# Patient Record
Sex: Female | Born: 1960 | Race: White | Hispanic: No | Marital: Married | Smoking: Never smoker
Health system: Southern US, Community
[De-identification: ages and names within clinical notes are randomized; demographics above are authoritative.]

## PROBLEM LIST (undated history)

## (undated) DIAGNOSIS — R253 Fasciculation: Secondary | ICD-10-CM

## (undated) DIAGNOSIS — M791 Myalgia, unspecified site: Secondary | ICD-10-CM

## (undated) DIAGNOSIS — K449 Diaphragmatic hernia without obstruction or gangrene: Secondary | ICD-10-CM

## (undated) HISTORY — DX: Diaphragmatic hernia without obstruction or gangrene: K44.9

## (undated) HISTORY — DX: Fasciculation: R25.3

## (undated) HISTORY — DX: Myalgia, unspecified site: M79.10

## (undated) HISTORY — PX: OTHER SURGICAL HISTORY: SHX169

---

## 2000-07-10 ENCOUNTER — Ambulatory Visit (HOSPITAL_COMMUNITY): Admission: RE | Admit: 2000-07-10 | Discharge: 2000-07-10 | Payer: Self-pay | Admitting: Pediatrics

## 2000-07-10 ENCOUNTER — Encounter: Payer: Self-pay | Admitting: Pediatrics

## 2000-07-16 ENCOUNTER — Encounter: Payer: Self-pay | Admitting: Pediatrics

## 2000-07-16 ENCOUNTER — Ambulatory Visit (HOSPITAL_COMMUNITY): Admission: RE | Admit: 2000-07-16 | Discharge: 2000-07-16 | Payer: Self-pay | Admitting: Pediatrics

## 2000-08-26 ENCOUNTER — Other Ambulatory Visit: Admission: RE | Admit: 2000-08-26 | Discharge: 2000-08-26 | Payer: Self-pay | Admitting: Specialist

## 2000-09-01 ENCOUNTER — Ambulatory Visit (HOSPITAL_COMMUNITY): Admission: RE | Admit: 2000-09-01 | Discharge: 2000-09-01 | Payer: Self-pay | Admitting: Internal Medicine

## 2002-07-06 ENCOUNTER — Encounter: Payer: Self-pay | Admitting: Specialist

## 2002-07-06 ENCOUNTER — Ambulatory Visit (HOSPITAL_COMMUNITY): Admission: RE | Admit: 2002-07-06 | Discharge: 2002-07-06 | Payer: Self-pay | Admitting: Specialist

## 2003-04-17 ENCOUNTER — Ambulatory Visit (HOSPITAL_COMMUNITY): Admission: RE | Admit: 2003-04-17 | Discharge: 2003-04-17 | Payer: Self-pay | Admitting: Internal Medicine

## 2004-09-17 ENCOUNTER — Ambulatory Visit (HOSPITAL_COMMUNITY): Admission: RE | Admit: 2004-09-17 | Discharge: 2004-09-17 | Payer: Self-pay | Admitting: Specialist

## 2005-09-18 ENCOUNTER — Ambulatory Visit (HOSPITAL_COMMUNITY): Admission: RE | Admit: 2005-09-18 | Discharge: 2005-09-18 | Payer: Self-pay | Admitting: Specialist

## 2006-09-21 ENCOUNTER — Ambulatory Visit (HOSPITAL_COMMUNITY): Admission: RE | Admit: 2006-09-21 | Discharge: 2006-09-21 | Payer: Self-pay | Admitting: Specialist

## 2006-10-14 ENCOUNTER — Ambulatory Visit (HOSPITAL_COMMUNITY): Admission: RE | Admit: 2006-10-14 | Discharge: 2006-10-14 | Payer: Self-pay | Admitting: Internal Medicine

## 2007-04-28 ENCOUNTER — Encounter: Payer: Self-pay | Admitting: Orthopedic Surgery

## 2007-05-23 ENCOUNTER — Encounter: Payer: Self-pay | Admitting: Orthopedic Surgery

## 2007-11-22 ENCOUNTER — Other Ambulatory Visit: Admission: RE | Admit: 2007-11-22 | Discharge: 2007-11-22 | Payer: Self-pay | Admitting: Obstetrics & Gynecology

## 2008-02-21 ENCOUNTER — Ambulatory Visit (HOSPITAL_COMMUNITY): Admission: RE | Admit: 2008-02-21 | Discharge: 2008-02-21 | Payer: Self-pay | Admitting: Internal Medicine

## 2008-06-02 ENCOUNTER — Ambulatory Visit (HOSPITAL_COMMUNITY): Admission: RE | Admit: 2008-06-02 | Discharge: 2008-06-02 | Payer: Self-pay | Admitting: Internal Medicine

## 2008-08-10 ENCOUNTER — Emergency Department (HOSPITAL_COMMUNITY): Admission: EM | Admit: 2008-08-10 | Discharge: 2008-08-10 | Payer: Self-pay | Admitting: Emergency Medicine

## 2008-09-12 ENCOUNTER — Ambulatory Visit (HOSPITAL_COMMUNITY): Admission: RE | Admit: 2008-09-12 | Discharge: 2008-09-12 | Payer: Self-pay | Admitting: Obstetrics & Gynecology

## 2008-12-11 ENCOUNTER — Other Ambulatory Visit: Admission: RE | Admit: 2008-12-11 | Discharge: 2008-12-11 | Payer: Self-pay | Admitting: Obstetrics & Gynecology

## 2008-12-13 ENCOUNTER — Ambulatory Visit (HOSPITAL_COMMUNITY): Admission: RE | Admit: 2008-12-13 | Discharge: 2008-12-13 | Payer: Self-pay | Admitting: Nurse Practitioner

## 2009-09-17 ENCOUNTER — Ambulatory Visit (HOSPITAL_COMMUNITY): Admission: RE | Admit: 2009-09-17 | Discharge: 2009-09-17 | Payer: Self-pay | Admitting: Internal Medicine

## 2010-04-15 ENCOUNTER — Encounter: Payer: Self-pay | Admitting: Specialist

## 2010-06-19 ENCOUNTER — Other Ambulatory Visit (HOSPITAL_COMMUNITY)
Admission: RE | Admit: 2010-06-19 | Discharge: 2010-06-19 | Disposition: A | Discharge status: Home / Self Care | Payer: BC Managed Care – PPO | Source: Ambulatory Visit | Attending: Obstetrics & Gynecology | Admitting: Obstetrics & Gynecology

## 2010-06-19 ENCOUNTER — Other Ambulatory Visit: Payer: Self-pay | Admitting: Obstetrics & Gynecology

## 2010-06-19 DIAGNOSIS — Z01419 Encounter for gynecological examination (general) (routine) without abnormal findings: Secondary | ICD-10-CM | POA: Insufficient documentation

## 2010-07-02 LAB — CBC
HCT: 38.9 % (ref 36.0–46.0)
WBC: 6.2 10*3/uL (ref 4.0–10.5)

## 2010-07-02 LAB — POCT CARDIAC MARKERS
CKMB, poc: <1 ng/mL — ABNORMAL LOW (ref 1.0–8.0)
Myoglobin, poc: 30.1 ng/mL (ref 12–200)
Troponin i, poc: <0.05 ng/mL (ref 0.00–0.09)

## 2010-07-02 LAB — URINE MICROSCOPIC-ADD ON

## 2010-07-02 LAB — URINE CULTURE
Colony Count: NO GROWTH
Culture: NO GROWTH

## 2010-07-02 LAB — COMPREHENSIVE METABOLIC PANEL WITH GFR
ALT: 13 U/L (ref 0–35)
AST: 15 U/L (ref 0–37)
Albumin: 3.8 g/dL (ref 3.5–5.2)
Alkaline Phosphatase: 49 U/L (ref 39–117)
BUN: 8 mg/dL (ref 6–23)
CO2: 25 meq/L (ref 19–32)
Calcium: 8.5 mg/dL (ref 8.4–10.5)
Chloride: 103 meq/L (ref 96–112)
Creatinine, Ser: 0.63 mg/dL (ref 0.4–1.2)
GFR calc non Af Amer: >60 mL/min
Glucose, Bld: 89 mg/dL (ref 70–99)
Potassium: 3.6 meq/L (ref 3.5–5.1)
Sodium: 135 meq/L (ref 135–145)
Total Bilirubin: 0.7 mg/dL (ref 0.3–1.2)
Total Protein: 7.3 g/dL (ref 6.0–8.3)

## 2010-07-02 LAB — URINALYSIS, ROUTINE W REFLEX MICROSCOPIC
Bilirubin Urine: NEGATIVE
Ketones, ur: NEGATIVE mg/dL
Leukocytes, UA: NEGATIVE
Specific Gravity, Urine: <1.005 — ABNORMAL LOW (ref 1.005–1.030)
Urobilinogen, UA: 0.2 mg/dL (ref 0.0–1.0)
pH: 6 (ref 5.0–8.0)

## 2010-07-02 LAB — DIFFERENTIAL
Basophils Absolute: 0 K/uL (ref 0.0–0.1)
Basophils Relative: 1 % (ref 0–1)
Eosinophils Absolute: 0.1 K/uL (ref 0.0–0.7)
Eosinophils Relative: 1 % (ref 0–5)
Lymphocytes Relative: 25 % (ref 12–46)
Lymphs Abs: 1.6 K/uL (ref 0.7–4.0)
Monocytes Absolute: 0.5 K/uL (ref 0.1–1.0)
Monocytes Relative: 8 % (ref 3–12)
Neutro Abs: 4.1 K/uL (ref 1.7–7.7)
Neutrophils Relative %: 66 % (ref 43–77)

## 2011-06-23 ENCOUNTER — Other Ambulatory Visit (HOSPITAL_COMMUNITY): Payer: Self-pay | Admitting: Internal Medicine

## 2011-06-23 DIAGNOSIS — Z139 Encounter for screening, unspecified: Secondary | ICD-10-CM

## 2011-06-24 ENCOUNTER — Ambulatory Visit (HOSPITAL_COMMUNITY)
Admission: RE | Admit: 2011-06-24 | Discharge: 2011-06-24 | Disposition: A | Discharge status: Home / Self Care | Payer: BC Managed Care – PPO | Source: Ambulatory Visit | Attending: Internal Medicine | Admitting: Internal Medicine

## 2011-06-24 DIAGNOSIS — N6459 Other signs and symptoms in breast: Secondary | ICD-10-CM | POA: Insufficient documentation

## 2011-06-24 DIAGNOSIS — Z139 Encounter for screening, unspecified: Secondary | ICD-10-CM

## 2011-06-24 DIAGNOSIS — Z1231 Encounter for screening mammogram for malignant neoplasm of breast: Secondary | ICD-10-CM | POA: Insufficient documentation

## 2011-06-26 ENCOUNTER — Other Ambulatory Visit: Payer: Self-pay | Admitting: Internal Medicine

## 2011-06-26 DIAGNOSIS — R928 Other abnormal and inconclusive findings on diagnostic imaging of breast: Secondary | ICD-10-CM

## 2011-06-27 ENCOUNTER — Other Ambulatory Visit: Payer: Self-pay | Admitting: Internal Medicine

## 2011-06-27 DIAGNOSIS — R928 Other abnormal and inconclusive findings on diagnostic imaging of breast: Secondary | ICD-10-CM

## 2011-07-02 ENCOUNTER — Ambulatory Visit
Admission: RE | Admit: 2011-07-02 | Discharge: 2011-07-02 | Disposition: A | Discharge status: Home / Self Care | Payer: BC Managed Care – PPO | Source: Ambulatory Visit | Attending: Internal Medicine | Admitting: Internal Medicine

## 2011-07-02 DIAGNOSIS — R928 Other abnormal and inconclusive findings on diagnostic imaging of breast: Secondary | ICD-10-CM

## 2011-07-07 ENCOUNTER — Other Ambulatory Visit (HOSPITAL_COMMUNITY)
Admission: RE | Admit: 2011-07-07 | Discharge: 2011-07-07 | Disposition: A | Discharge status: Home / Self Care | Payer: BC Managed Care – PPO | Source: Ambulatory Visit | Attending: Obstetrics & Gynecology | Admitting: Obstetrics & Gynecology

## 2011-07-07 ENCOUNTER — Other Ambulatory Visit: Payer: Self-pay | Admitting: Obstetrics & Gynecology

## 2011-07-07 DIAGNOSIS — Z01419 Encounter for gynecological examination (general) (routine) without abnormal findings: Secondary | ICD-10-CM | POA: Insufficient documentation

## 2011-12-08 ENCOUNTER — Other Ambulatory Visit (HOSPITAL_COMMUNITY): Payer: Self-pay | Admitting: Family Medicine

## 2011-12-08 DIAGNOSIS — R109 Unspecified abdominal pain: Secondary | ICD-10-CM

## 2011-12-11 ENCOUNTER — Other Ambulatory Visit (HOSPITAL_COMMUNITY): Payer: BC Managed Care – PPO

## 2011-12-15 ENCOUNTER — Ambulatory Visit (HOSPITAL_COMMUNITY)
Admission: RE | Admit: 2011-12-15 | Discharge: 2011-12-15 | Disposition: A | Discharge status: Home / Self Care | Payer: BC Managed Care – PPO | Source: Ambulatory Visit | Attending: Family Medicine | Admitting: Family Medicine

## 2011-12-15 DIAGNOSIS — R109 Unspecified abdominal pain: Secondary | ICD-10-CM

## 2012-06-30 ENCOUNTER — Other Ambulatory Visit: Payer: Self-pay

## 2012-06-30 DIAGNOSIS — Z1231 Encounter for screening mammogram for malignant neoplasm of breast: Secondary | ICD-10-CM

## 2012-07-13 ENCOUNTER — Ambulatory Visit
Admission: RE | Admit: 2012-07-13 | Discharge: 2012-07-13 | Disposition: A | Discharge status: Home / Self Care | Payer: BC Managed Care – PPO | Source: Ambulatory Visit

## 2012-07-13 DIAGNOSIS — Z1231 Encounter for screening mammogram for malignant neoplasm of breast: Secondary | ICD-10-CM

## 2013-06-10 ENCOUNTER — Other Ambulatory Visit: Payer: Self-pay

## 2013-06-10 DIAGNOSIS — Z1231 Encounter for screening mammogram for malignant neoplasm of breast: Secondary | ICD-10-CM

## 2013-09-07 ENCOUNTER — Ambulatory Visit
Admission: RE | Admit: 2013-09-07 | Discharge: 2013-09-07 | Disposition: A | Discharge status: Home / Self Care | Payer: BC Managed Care – PPO | Source: Ambulatory Visit

## 2013-09-07 DIAGNOSIS — Z1231 Encounter for screening mammogram for malignant neoplasm of breast: Secondary | ICD-10-CM

## 2013-11-10 ENCOUNTER — Other Ambulatory Visit (HOSPITAL_COMMUNITY): Payer: Self-pay | Admitting: Internal Medicine

## 2013-11-10 DIAGNOSIS — Z139 Encounter for screening, unspecified: Secondary | ICD-10-CM

## 2013-11-10 DIAGNOSIS — Z13820 Encounter for screening for osteoporosis: Secondary | ICD-10-CM

## 2013-11-16 ENCOUNTER — Ambulatory Visit (HOSPITAL_COMMUNITY)
Admission: RE | Admit: 2013-11-16 | Discharge: 2013-11-16 | Disposition: A | Discharge status: Home / Self Care | Payer: BC Managed Care – PPO | Source: Ambulatory Visit | Attending: Internal Medicine | Admitting: Internal Medicine

## 2013-11-16 DIAGNOSIS — Z13820 Encounter for screening for osteoporosis: Secondary | ICD-10-CM | POA: Diagnosis not present

## 2014-03-31 ENCOUNTER — Other Ambulatory Visit (HOSPITAL_COMMUNITY): Payer: Self-pay | Admitting: Internal Medicine

## 2014-03-31 ENCOUNTER — Ambulatory Visit (HOSPITAL_COMMUNITY)
Admission: RE | Admit: 2014-03-31 | Discharge: 2014-03-31 | Disposition: A | Discharge status: Home / Self Care | Payer: BC Managed Care – PPO | Source: Ambulatory Visit | Attending: Internal Medicine | Admitting: Internal Medicine

## 2014-03-31 DIAGNOSIS — M25551 Pain in right hip: Secondary | ICD-10-CM

## 2014-03-31 DIAGNOSIS — R609 Edema, unspecified: Secondary | ICD-10-CM

## 2014-06-01 ENCOUNTER — Ambulatory Visit (HOSPITAL_COMMUNITY)
Admission: RE | Admit: 2014-06-01 | Discharge: 2014-06-01 | Disposition: A | Discharge status: Home / Self Care | Payer: BC Managed Care – PPO | Source: Ambulatory Visit | Attending: Internal Medicine | Admitting: Internal Medicine

## 2014-06-01 ENCOUNTER — Other Ambulatory Visit (HOSPITAL_COMMUNITY): Payer: Self-pay | Admitting: Internal Medicine

## 2014-06-01 DIAGNOSIS — M545 Low back pain: Secondary | ICD-10-CM

## 2014-09-05 ENCOUNTER — Ambulatory Visit (HOSPITAL_COMMUNITY)
Admission: RE | Admit: 2014-09-05 | Discharge: 2014-09-05 | Disposition: A | Discharge status: Home / Self Care | Payer: BC Managed Care – PPO | Source: Ambulatory Visit | Attending: Internal Medicine | Admitting: Internal Medicine

## 2014-09-05 ENCOUNTER — Other Ambulatory Visit (HOSPITAL_COMMUNITY): Payer: Self-pay | Admitting: Internal Medicine

## 2014-09-05 DIAGNOSIS — M542 Cervicalgia: Secondary | ICD-10-CM | POA: Diagnosis not present

## 2014-10-03 ENCOUNTER — Ambulatory Visit (INDEPENDENT_AMBULATORY_CARE_PROVIDER_SITE_OTHER): Payer: BC Managed Care – PPO | Admitting: Orthopedic Surgery

## 2014-10-03 VITALS — BP 120/74 | Ht 64.0 in | Wt 160.0 lb

## 2014-10-03 DIAGNOSIS — M542 Cervicalgia: Secondary | ICD-10-CM

## 2014-10-03 NOTE — Progress Notes (Signed)
Patient ID: Cassandra JourneySuzanne A Taylor, female   DOB: 04/14/1960, 54 y.o.   MRN: [id number] New   Chief Complaint  Patient presents with  . Back Pain    constant discomfort in c spine and T spine, radiates to right shoulder, refer [facility] hall     Cassandra Taylor is a 54 y.o. female.   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 Her neck pain is at the central portion of the upper thoracic and lower cervical spine and is associated with stiffness and occasional radiating pain up into the upper chest area.   It was recommended that she have physical therapy but she wanted an orthopedic opinion.  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 Her thoracic spine shows a subtle curvature of the thoracolumbar spine which is convex right no change from previous x-rays. Review of Systems See hpi   Social History History  Substance Use Topics  . Smoking status: Not on file  . Smokeless tobacco: Not on file  . Alcohol Use: Not on file      No current outpatient prescriptions on file.   No current facility-administered medications for this visit.       Physical Exam Blood pressure 120/74, height 5\' 4"  (1.626 m), weight 160 lb (72.576 kg). Physical Exam The patient is well developed well nourished and well groomed. Orientation to person place and time is normal   Mood is pleasant. Ambulatory status  Normal ambulatory status  We did a thorough neurologic exam and we didn't find any abnormalities. She has some mild discomfort when she rotated her neck back and forth and up and down and side to side but otherwise that was normal and there was no evidence of radicular symptoms with that maneuver. She has some tenderness at the C6-7 disc space. Her upper extremities showed no abnormalities for range of motion no instabilities no sensory changes no  vascular changes no reflex deficits good pulses and there is no lymphadenopath  Data Reviewed See hpi  Assessment Encounter Diagnosis  Name Primary?  . Cervicalgia Yes    Plan  physical therapy  800 mg ibuprofen

## 2014-10-03 NOTE — Patient Instructions (Signed)
Call [facility] in [place] to schedule therapy

## 2014-10-17 ENCOUNTER — Other Ambulatory Visit: Payer: Self-pay

## 2014-10-17 DIAGNOSIS — Z1231 Encounter for screening mammogram for malignant neoplasm of breast: Secondary | ICD-10-CM

## 2014-10-18 ENCOUNTER — Ambulatory Visit
Admission: RE | Admit: 2014-10-18 | Discharge: 2014-10-18 | Disposition: A | Discharge status: Home / Self Care | Payer: BC Managed Care – PPO | Source: Ambulatory Visit

## 2014-10-18 DIAGNOSIS — Z1231 Encounter for screening mammogram for malignant neoplasm of breast: Secondary | ICD-10-CM

## 2016-03-12 ENCOUNTER — Other Ambulatory Visit: Payer: Self-pay | Admitting: Internal Medicine

## 2016-03-12 DIAGNOSIS — Z1231 Encounter for screening mammogram for malignant neoplasm of breast: Secondary | ICD-10-CM

## 2016-03-13 ENCOUNTER — Ambulatory Visit
Admission: RE | Admit: 2016-03-13 | Discharge: 2016-03-13 | Disposition: A | Discharge status: Home / Self Care | Payer: BC Managed Care – PPO | Source: Ambulatory Visit | Attending: Internal Medicine | Admitting: Internal Medicine

## 2016-03-13 DIAGNOSIS — Z1231 Encounter for screening mammogram for malignant neoplasm of breast: Secondary | ICD-10-CM

## 2017-04-08 ENCOUNTER — Other Ambulatory Visit: Payer: Self-pay | Admitting: Internal Medicine

## 2017-04-08 DIAGNOSIS — Z139 Encounter for screening, unspecified: Secondary | ICD-10-CM

## 2017-04-29 ENCOUNTER — Ambulatory Visit
Admission: RE | Admit: 2017-04-29 | Discharge: 2017-04-29 | Disposition: A | Discharge status: Home / Self Care | Payer: BC Managed Care – PPO | Source: Ambulatory Visit | Attending: Internal Medicine | Admitting: Internal Medicine

## 2017-04-29 DIAGNOSIS — Z139 Encounter for screening, unspecified: Secondary | ICD-10-CM

## 2018-06-04 ENCOUNTER — Other Ambulatory Visit: Payer: Self-pay | Admitting: Internal Medicine

## 2018-06-04 DIAGNOSIS — Z1231 Encounter for screening mammogram for malignant neoplasm of breast: Secondary | ICD-10-CM

## 2018-07-05 ENCOUNTER — Ambulatory Visit: Payer: BC Managed Care – PPO

## 2018-08-17 ENCOUNTER — Ambulatory Visit: Payer: BC Managed Care – PPO

## 2018-08-23 ENCOUNTER — Ambulatory Visit: Payer: BC Managed Care – PPO

## 2018-09-17 ENCOUNTER — Ambulatory Visit: Payer: BC Managed Care – PPO

## 2018-10-26 ENCOUNTER — Other Ambulatory Visit: Payer: Self-pay

## 2018-10-26 ENCOUNTER — Ambulatory Visit
Admission: RE | Admit: 2018-10-26 | Discharge: 2018-10-26 | Disposition: A | Discharge status: Home / Self Care | Payer: BC Managed Care – PPO | Source: Ambulatory Visit | Attending: Internal Medicine | Admitting: Internal Medicine

## 2018-10-26 DIAGNOSIS — Z1231 Encounter for screening mammogram for malignant neoplasm of breast: Secondary | ICD-10-CM

## 2018-10-28 ENCOUNTER — Other Ambulatory Visit: Payer: Self-pay | Admitting: Internal Medicine

## 2018-10-28 DIAGNOSIS — R928 Other abnormal and inconclusive findings on diagnostic imaging of breast: Secondary | ICD-10-CM

## 2018-11-01 ENCOUNTER — Ambulatory Visit
Admission: RE | Admit: 2018-11-01 | Discharge: 2018-11-01 | Disposition: A | Discharge status: Home / Self Care | Payer: BC Managed Care – PPO | Source: Ambulatory Visit | Attending: Internal Medicine | Admitting: Internal Medicine

## 2018-11-01 ENCOUNTER — Other Ambulatory Visit: Payer: Self-pay

## 2018-11-01 DIAGNOSIS — R928 Other abnormal and inconclusive findings on diagnostic imaging of breast: Secondary | ICD-10-CM

## 2018-11-02 ENCOUNTER — Other Ambulatory Visit: Payer: Self-pay | Admitting: Internal Medicine

## 2018-11-02 ENCOUNTER — Other Ambulatory Visit: Payer: Self-pay | Admitting: Dermatology

## 2019-09-30 ENCOUNTER — Other Ambulatory Visit: Payer: Self-pay | Admitting: Internal Medicine

## 2019-09-30 DIAGNOSIS — Z1231 Encounter for screening mammogram for malignant neoplasm of breast: Secondary | ICD-10-CM

## 2019-10-27 ENCOUNTER — Ambulatory Visit: Payer: BC Managed Care – PPO

## 2019-11-03 ENCOUNTER — Ambulatory Visit
Admission: RE | Admit: 2019-11-03 | Discharge: 2019-11-03 | Disposition: A | Discharge status: Home / Self Care | Payer: BC Managed Care – PPO | Source: Ambulatory Visit | Attending: Internal Medicine | Admitting: Internal Medicine

## 2019-11-03 ENCOUNTER — Other Ambulatory Visit: Payer: Self-pay

## 2019-11-03 DIAGNOSIS — Z1231 Encounter for screening mammogram for malignant neoplasm of breast: Secondary | ICD-10-CM

## 2019-11-04 ENCOUNTER — Other Ambulatory Visit: Payer: Self-pay | Admitting: Internal Medicine

## 2019-11-04 DIAGNOSIS — R928 Other abnormal and inconclusive findings on diagnostic imaging of breast: Secondary | ICD-10-CM

## 2019-11-18 ENCOUNTER — Other Ambulatory Visit: Payer: Self-pay | Admitting: Internal Medicine

## 2019-11-18 ENCOUNTER — Ambulatory Visit
Admission: RE | Admit: 2019-11-18 | Discharge: 2019-11-18 | Disposition: A | Discharge status: Home / Self Care | Payer: BC Managed Care – PPO | Source: Ambulatory Visit | Attending: Internal Medicine | Admitting: Internal Medicine

## 2019-11-18 ENCOUNTER — Other Ambulatory Visit: Payer: Self-pay

## 2019-11-18 DIAGNOSIS — R928 Other abnormal and inconclusive findings on diagnostic imaging of breast: Secondary | ICD-10-CM

## 2019-11-21 ENCOUNTER — Other Ambulatory Visit: Payer: Self-pay | Admitting: Internal Medicine

## 2019-11-21 DIAGNOSIS — R928 Other abnormal and inconclusive findings on diagnostic imaging of breast: Secondary | ICD-10-CM

## 2019-11-30 ENCOUNTER — Ambulatory Visit
Admission: RE | Admit: 2019-11-30 | Discharge: 2019-11-30 | Disposition: A | Discharge status: Home / Self Care | Payer: BC Managed Care – PPO | Source: Ambulatory Visit | Attending: Internal Medicine | Admitting: Internal Medicine

## 2019-11-30 ENCOUNTER — Other Ambulatory Visit (HOSPITAL_COMMUNITY)
Admission: RE | Admit: 2019-11-30 | Discharge: 2019-11-30 | Disposition: A | Discharge status: Home / Self Care | Payer: BC Managed Care – PPO | Source: Ambulatory Visit | Attending: Diagnostic Radiology | Admitting: Diagnostic Radiology

## 2019-11-30 ENCOUNTER — Other Ambulatory Visit: Payer: Self-pay

## 2019-11-30 DIAGNOSIS — R928 Other abnormal and inconclusive findings on diagnostic imaging of breast: Secondary | ICD-10-CM

## 2019-11-30 HISTORY — PX: BREAST BIOPSY: SHX20

## 2019-12-01 LAB — SURGICAL PATHOLOGY

## 2020-01-26 ENCOUNTER — Encounter: Payer: Self-pay | Admitting: Neurology

## 2020-01-26 ENCOUNTER — Ambulatory Visit: Payer: BC Managed Care – PPO | Admitting: Neurology

## 2020-01-26 VITALS — BP 148/90 | HR 84 | Ht 64.0 in | Wt 177.0 lb

## 2020-01-26 DIAGNOSIS — R253 Fasciculation: Secondary | ICD-10-CM | POA: Diagnosis not present

## 2020-01-26 MED ORDER — DULOXETINE HCL 30 MG PO CPEP: 30.0000 mg | ORAL_CAPSULE | Freq: Every day | ORAL | 3 refills | Status: DC

## 2020-01-26 NOTE — Progress Notes (Signed)
Chief Complaint  Patient presents with  . New Patient (Initial Visit)    Referred for fasciculations in variouls muscles. She seems to notice it more in her right arm. Previously seen by [facility].   Marland Kitchen PCP    Benita Stabile, MD    HISTORICAL  Cassandra Taylor is a 59 year old female, seen in request by her primary care doctor Benita Stabile, for evaluation of muscle fasciculations, initial evaluation was on January 26, 2020.  I reviewed and summarized the referring note,  She began to experience muscle fasciculation in 2019, initially triggered by walk on treadmill for 1 mile, and there is mild weight lifting, the next day following her exercise, she felt her left hand muscle twitching, as if the cell phone was Vaseline in her hand, since then, she began to experience intermittent muscle fasciculations, she d describes buzzing, flutter sensation at the bottom of her feet, sometimes intermittent muscle twitching involving other part of her body, such as left arm, right arm, buttock region,  Muscle fasciculation usually triggered by heavy lifting, muscle exertion, she denies muscle weakness, denies sensory changes, there was no limitation in her daily activity,  She did have diagnosis of myofascial pain, was seen by rheumatologist, no clear etiology found, was given prescription of Flexeril, which has helped for  She works as a Runner, broadcasting/film/video, 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  REVIEW OF SYSTEMS: Full 14 system review of systems performed and notable only for as above all other review of systems were negative.  ALLERGIES: No Known Allergies  HOME MEDICATIONS: Current Outpatient Medications  Medication Sig Dispense Refill  .  cyclobenzaprine (FLEXERIL) 5 MG tablet Take 5 mg by mouth at bedtime.    Marland Kitchen ibuprofen (ADVIL) 200 MG tablet Take 400 mg by mouth at bedtime.      No current facility-administered medications for this visit.    PAST MEDICAL HISTORY: Past Medical History:  Diagnosis Date  . Fasciculation   . Muscle pain     PAST SURGICAL HISTORY: Past Surgical History:  Procedure Laterality Date  . No past surgery      FAMILY HISTORY: Family History  Problem Relation Age of Onset  . Alzheimer's disease Mother   . COPD Father     SOCIAL HISTORY: Social History   Socioeconomic History  . Marital status: Married    Spouse name: Not on file  . Number of children: 2  . Years of education: college  . Highest education level: Not on file  Occupational History  . Occupation: Runner, broadcasting/film/video  Tobacco Use  . Smoking status: Never Smoker  . Smokeless tobacco: Never Used  Substance and Sexual Activity  . Alcohol use: Yes    Comment: rarely  . Drug use: Never  . Sexual activity: Not on file  Other Topics Concern  . Not on file  Social History Narrative   Lives at home with husband.   One soda per day.   Right-handed.   Social Determinants of Health   Financial Resource Strain:   . Difficulty of Paying Living Expenses: Not on file  Food Insecurity:   . Worried About Programme researcher, broadcasting/film/video in the Last Year: Not on file  . Ran Out of  Food in the Last Year: Not on file  Transportation Needs:   . Lack of Transportation (Medical): Not on file  . Lack of Transportation (Non-Medical): Not on file  Physical Activity:   . Days of Exercise per Week: Not on file  . Minutes of Exercise per Session: Not on file  Stress:   . Feeling of Stress : Not on file  Social Connections:   . Frequency of Communication with Friends and Family: Not on file  . Frequency of Social Gatherings with Friends and Family: Not on file  . Attends Religious Services: Not on file  . Active Member of Clubs or Organizations: Not  on file  . Attends Banker Meetings: Not on file  . Marital Status: Not on file  Intimate Partner Violence:   . Fear of Current or Ex-Partner: Not on file  . Emotionally Abused: Not on file  . Physically Abused: Not on file  . Sexually Abused: Not on file     PHYSICAL EXAM   Vitals:   01/26/20 0828  BP: (!) 148/90  Pulse: 84  Weight: 177 lb (80.3 kg)  Height: 5\' 4"  (1.626 m)   Not recorded     Body mass index is 30.38 kg/m.  PHYSICAL EXAMNIATION:  Gen: NAD, conversant, well nourised, well groomed                     Cardiovascular: Regular rate rhythm, no peripheral edema, warm, nontender. Eyes: Conjunctivae clear without exudates or hemorrhage Neck: Supple, no carotid bruits. Pulmonary: Clear to auscultation bilaterally   NEUROLOGICAL EXAM:  MENTAL STATUS: Speech:    Speech is normal; fluent and spontaneous with normal comprehension.  Cognition:     Orientation to time, place and person     Normal recent and remote memory     Normal Attention span and concentration     Normal Language, naming, repeating,spontaneous speech     Fund of knowledge   CRANIAL NERVES: CN II: Visual fields are full to confrontation. Pupils are round equal and briskly reactive to light. CN III, IV, VI: extraocular movement are normal. No ptosis. CN V: Facial sensation is intact to light touch CN VII: Face is symmetric with normal eye closure  CN VIII: Hearing is normal to causal conversation. CN IX, X: Phonation is normal. CN XI: Head turning and shoulder shrug are intact  MOTOR: There is no pronator drift of out-stretched arms. Muscle bulk and tone are normal. Muscle strength is normal.  REFLEXES: Reflexes are 2+ and symmetric at the biceps, triceps, knees, and ankles. Plantar responses are flexor.  SENSORY: Intact to light touch, pinprick and vibratory sensation are intact in fingers and toes.  COORDINATION: There is no trunk or limb dysmetria  noted.  GAIT/STANCE: Posture is normal. Gait is steady with normal steps, base, arm swing, and turning. Heel and toe walking are normal. Tandem gait is normal.  Romberg is absent.   DIAGNOSTIC DATA (LABS, IMAGING, TESTING) - I reviewed patient records, labs, notes, testing and imaging myself where available.   ASSESSMENT AND PLAN  LATOIA EYSTER is a 59 y.o. female   Muscle fasciculations  From history history, it has been ongoing for 2 years, no muscle weakness, no evidence of muscle atrophy, slight brisk reflex, no evidence of pathologic hyperreflexia  Most likely benign muscle fasciculations  Repeat EMG nerve conduction study  Laboratory evaluation to rule out treatable etiology   60, M.D. Ph.D.  [facility]  [address] [place], [place] [address] Ph: [phone] Fax: [phone]  CC:  Benita Stabile, MD [address] [place],  [place] [address]

## 2020-01-28 LAB — TSH: TSH: 2.2 u[IU]/mL (ref 0.450–4.500)

## 2020-01-28 LAB — CK: Total CK: 81 U/L (ref 32–182)

## 2020-01-28 LAB — FERRITIN: Ferritin: 113 ng/mL (ref 15–150)

## 2020-01-28 LAB — SEDIMENTATION RATE: Sed Rate: 11 mm/hr (ref 0–40)

## 2020-01-28 LAB — VITAMIN B12: Vitamin B-12: 381 pg/mL (ref 232–1245)

## 2020-01-28 LAB — FOLATE: Folate: 7.8 ng/mL (ref >3.0)

## 2020-01-28 LAB — COPPER, SERUM: Copper: 142 ug/dL (ref 80–158)

## 2020-01-28 LAB — ANA W/REFLEX IF POSITIVE: Anti Nuclear Antibody (ANA): NEGATIVE

## 2020-01-28 LAB — C-REACTIVE PROTEIN: CRP: 8 mg/L (ref 0–10)

## 2020-01-28 LAB — VITAMIN D 25 HYDROXY (VIT D DEFICIENCY, FRACTURES): Vit D, 25-Hydroxy: 20 ng/mL — ABNORMAL LOW (ref 30.0–100.0)

## 2020-02-15 ENCOUNTER — Ambulatory Visit (INDEPENDENT_AMBULATORY_CARE_PROVIDER_SITE_OTHER): Payer: BC Managed Care – PPO | Admitting: Neurology

## 2020-02-15 ENCOUNTER — Encounter: Payer: BC Managed Care – PPO | Admitting: Neurology

## 2020-02-15 DIAGNOSIS — R253 Fasciculation: Secondary | ICD-10-CM

## 2020-02-15 DIAGNOSIS — Z0289 Encounter for other administrative examinations: Secondary | ICD-10-CM

## 2020-02-15 NOTE — Procedures (Signed)
Full Name: Cassandra Taylor Gender: Female MRN #: [id number] Date of Birth: 09-Apr-1960    Visit Date: 02/15/2020 08:47 Age: 59 Years Examining Physician: Levert Feinstein, MD  Referring Physician: Levert Feinstein, MD Height: 5 feet 4 inch Patient History: 59 year old female presented with few years history of intermittent muscle fasciculation, there was no evidence of muscle atrophy or weakness  Summary of the tests:  Nerve conduction study: Right sural, superficial peroneal, median, ulnar sensory responses were normal  Right peroneal to EDB, tibial, median and ulnar motor responses were normal  Electromyography: Selected needle examination of right lower extremity, upper extremity, right lumbosacral paraspinal and cervical paraspinal muscles were normal.    Conclusion: This is a normal study.  There was no electrodiagnostic evidence of right lumbosacral radiculopathy or right cervical radiculopathy.    ------------------------------- Levert Feinstein, M.D. PhD  [facility] [address] [place], [place] [address] Tel: [phone] Fax: [phone]  Verbal informed consent was obtained from the patient, patient was informed of potential risk of procedure, including bruising, bleeding, hematoma formation, infection, muscle weakness, muscle pain, numbness, among others.         MNC    Nerve / Sites Muscle Latency Ref. Amplitude Ref. Rel Amp Segments Distance Velocity Ref. Area    ms ms mV mV %  cm m/s m/s mVms  R Median - APB     Wrist APB 3.5 ?4.4 7.3 ?4.0 100 Wrist - APB 7   37.2     Upper arm APB 7.1  7.4  101 Upper arm - Wrist 20 56 ?49 36.0  R Ulnar - ADM     Wrist ADM 2.7 ?3.3 13.2 ?6.0 100 Wrist - ADM 7   35.7     B.Elbow ADM 5.7  12.9  97.7 B.Elbow - Wrist 19 64 ?49 35.8     A.Elbow ADM 7.3  12.4  96.5 A.Elbow - B.Elbow 10 62 ?49 35.1         A.Elbow - Wrist      R Peroneal - EDB     Ankle EDB 4.2 ?6.5 6.7 ?2.0 100 Ankle - EDB 9   27.6     Fib head EDB  9.3  5.9  87.7 Fib head - Ankle 27 53 ?44 28.5     Pop fossa EDB 11.4  6.0  102 Pop fossa - Fib head 10 49 ?44 26.4         Pop fossa - Ankle      R Tibial - AH     Ankle AH 4.7 ?5.8 10.5 ?4.0 100 Ankle - AH 9   29.6     Pop fossa AH 13.1  9.2  87.4 Pop fossa - Ankle 37 44 ?41 24.3             SNC    Nerve / Sites Rec. Site Peak Lat Ref.  Amp Ref. Segments Distance Peak Diff Ref.    ms ms V V  cm ms ms  R Sural - Ankle (Calf)     Calf Ankle 2.9 ?4.4 23 ?6 Calf - Ankle 14    R Superficial peroneal - Ankle     Lat leg Ankle 3.4 ?4.4 11 ?6 Lat leg - Ankle 14    R Median, Ulnar - Transcarpal comparison     Median Palm Wrist 2.1 ?2.2 122 ?35 Median Palm - Wrist 8       Ulnar Palm Wrist 2.1 ?2.2 28 ?12  Ulnar Palm - Wrist 8          Median Palm - Ulnar Palm  0.0 ?0.4  R Median - Orthodromic (Dig II, Mid palm)     Dig II Wrist 3.0 ?3.4 23 ?10 Dig II - Wrist 13    R Ulnar - Orthodromic, (Dig V, Mid palm)     Dig V Wrist 2.6 ?3.1 12 ?5 Dig V - Wrist 35                 F  Wave    Nerve F Lat Ref.   ms ms  R Tibial - AH 48.2 ?56.0  R Ulnar - ADM 25.4 ?32.0         EMG Summary Table    Spontaneous MUAP Recruitment  Muscle IA Fib PSW Fasc Other Amp Dur. Poly Pattern  R. Tibialis anterior Normal None None None _______ Normal Normal Normal Normal  R. Tibialis posterior Normal None None None _______ Normal Normal Normal Normal  R. Peroneus longus Normal None None None _______ Normal Normal Normal Normal  R. Gastrocnemius (Medial head) Normal None None None _______ Normal Normal Normal Normal  R. Vastus lateralis Normal None None None _______ Normal Normal Normal Normal  R. Lumbar paraspinals (low) Normal None None None _______ Normal Normal Normal Normal  R. Lumbar paraspinals (mid) Normal None None None _______ Normal Normal Normal Normal  R. First dorsal interosseous Normal None None None _______ Normal Normal Normal Normal  R. Pronator teres Normal None None None _______ Normal Normal  Normal Normal  R. Biceps brachii Normal None None None _______ Normal Normal Normal Normal  R. Deltoid Normal None None None _______ Normal Normal Normal Normal  R. Cervical paraspinals Normal None None None _______ Normal Normal Normal Normal

## 2020-06-06 ENCOUNTER — Other Ambulatory Visit: Payer: Self-pay | Admitting: Internal Medicine

## 2020-06-06 DIAGNOSIS — R928 Other abnormal and inconclusive findings on diagnostic imaging of breast: Secondary | ICD-10-CM

## 2020-07-09 ENCOUNTER — Other Ambulatory Visit: Payer: Self-pay

## 2020-07-09 ENCOUNTER — Ambulatory Visit
Admission: RE | Admit: 2020-07-09 | Discharge: 2020-07-09 | Disposition: A | Discharge status: Home / Self Care | Payer: BC Managed Care – PPO | Source: Ambulatory Visit | Attending: Internal Medicine | Admitting: Internal Medicine

## 2020-07-09 ENCOUNTER — Other Ambulatory Visit: Payer: BC Managed Care – PPO

## 2020-07-09 DIAGNOSIS — R928 Other abnormal and inconclusive findings on diagnostic imaging of breast: Secondary | ICD-10-CM

## 2020-10-08 ENCOUNTER — Other Ambulatory Visit: Payer: Self-pay | Admitting: Internal Medicine

## 2020-10-08 DIAGNOSIS — Z1231 Encounter for screening mammogram for malignant neoplasm of breast: Secondary | ICD-10-CM

## 2020-11-22 ENCOUNTER — Other Ambulatory Visit: Payer: Self-pay

## 2020-11-22 ENCOUNTER — Ambulatory Visit
Admission: RE | Admit: 2020-11-22 | Discharge: 2020-11-22 | Disposition: A | Discharge status: Home / Self Care | Payer: Self-pay | Source: Ambulatory Visit | Attending: Internal Medicine | Admitting: Internal Medicine

## 2020-11-22 DIAGNOSIS — Z1231 Encounter for screening mammogram for malignant neoplasm of breast: Secondary | ICD-10-CM

## 2020-11-29 ENCOUNTER — Other Ambulatory Visit: Payer: Self-pay | Admitting: Internal Medicine

## 2020-11-29 DIAGNOSIS — R928 Other abnormal and inconclusive findings on diagnostic imaging of breast: Secondary | ICD-10-CM

## 2020-12-20 ENCOUNTER — Other Ambulatory Visit: Payer: Self-pay

## 2021-09-09 IMAGING — MG MM DIGITAL SCREENING BILAT W/ TOMO AND CAD
8 series · 8 of 24 positions shown · non-contrast
Comparison: Previous exams.

CLINICAL DATA: Screening. History of right axillary lymph node
biopsy November 2019 with pathology revealing reactive lymph node
hyperplasia with flow cytometry attempted but failed due to
insufficient number of cells.

EXAM:
DIGITAL SCREENING BILATERAL MAMMOGRAM WITH TOMOSYNTHESIS AND CAD
TECHNIQUE: Bilateral screening digital craniocaudal and mediolateral oblique
mammograms were obtained. Bilateral screening digital breast
tomosynthesis was performed. The images were evaluated with
computer-aided detection.

[L MLO synth-2D]
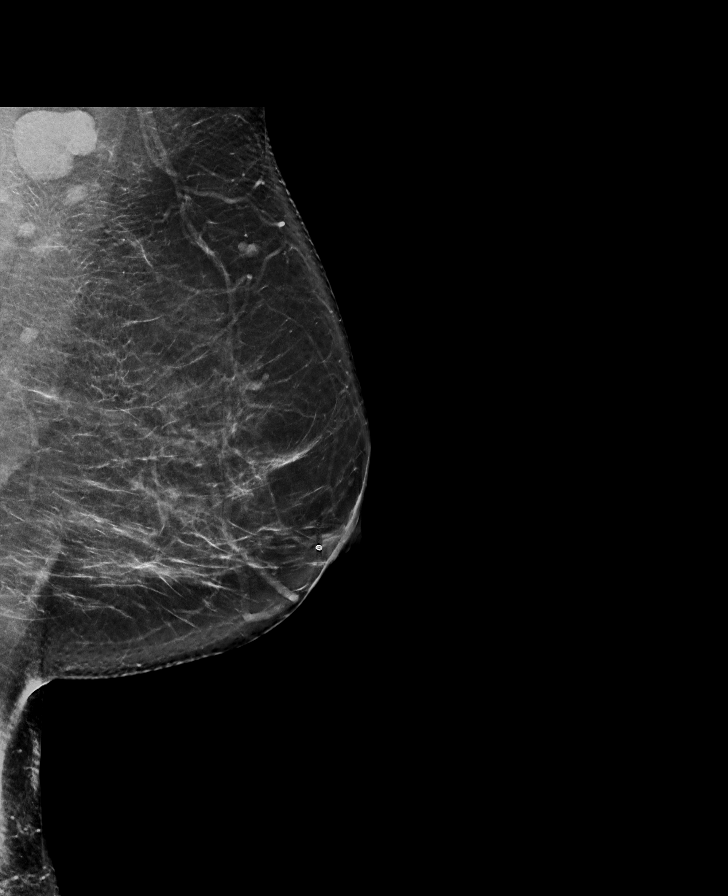

[R CC synth-2D]
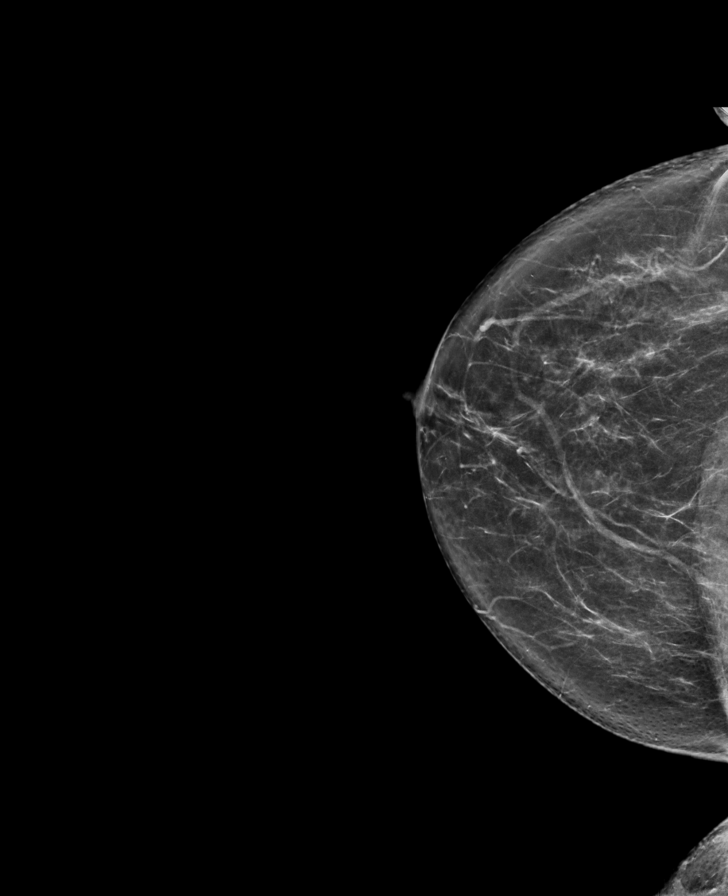

[R MLO synth-2D]
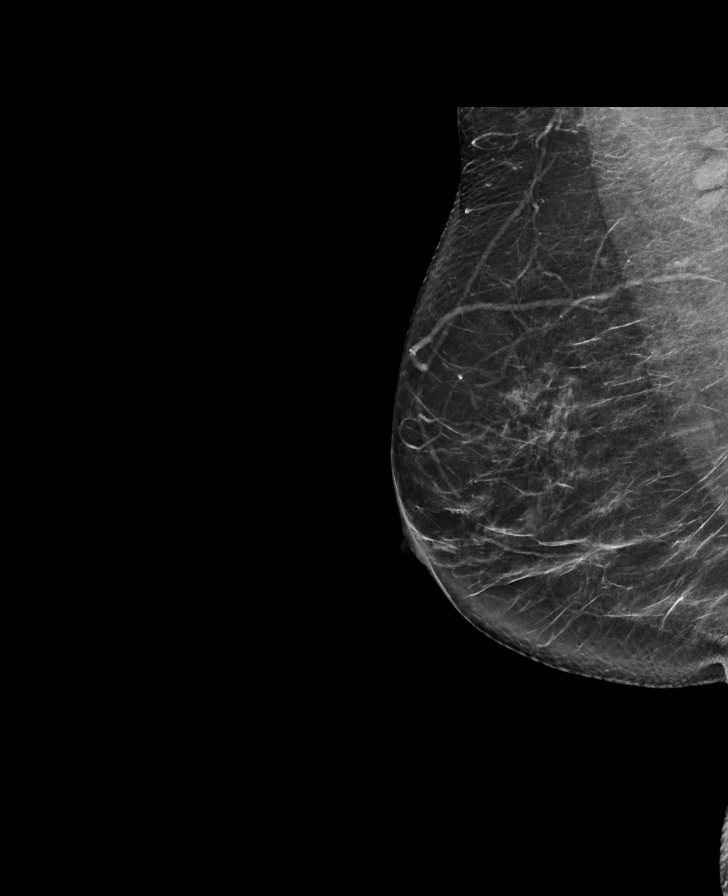

[L CC synth-2D]
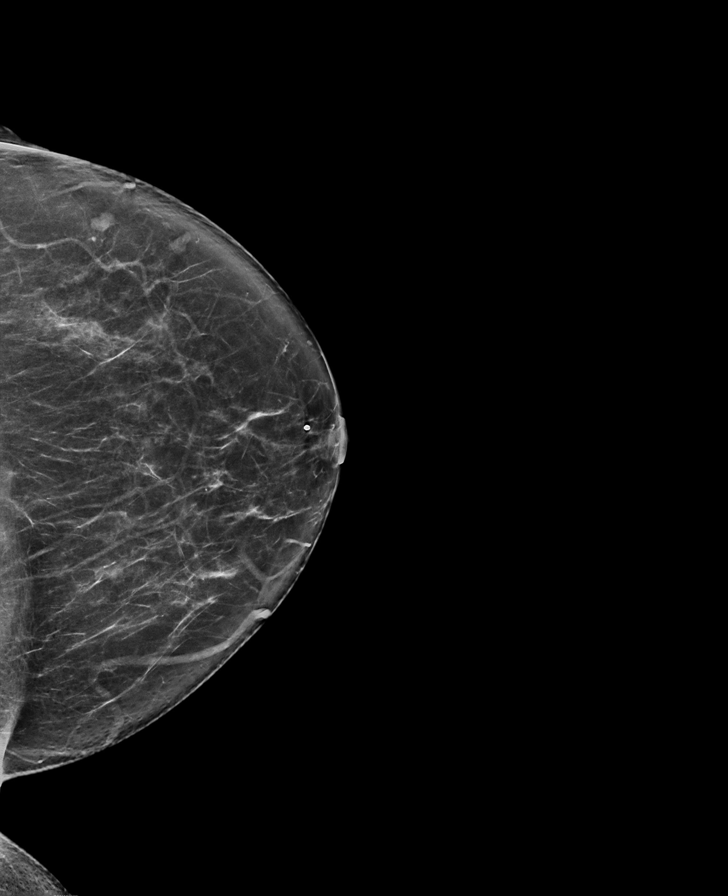

[R CC tomo · tomo slice 37/73.0]
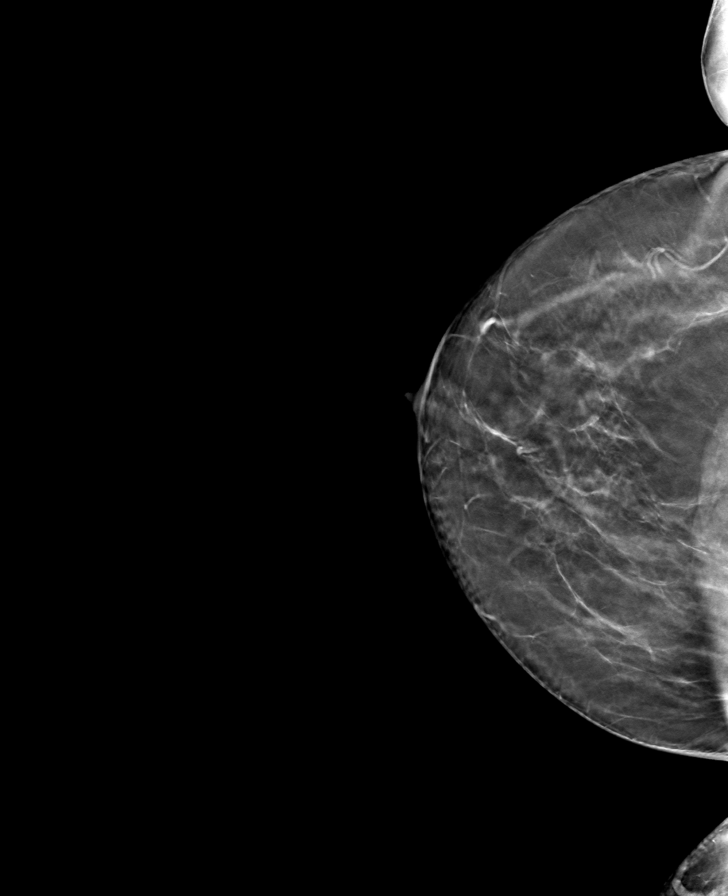

[L CC tomo · tomo slice 39/76.0]
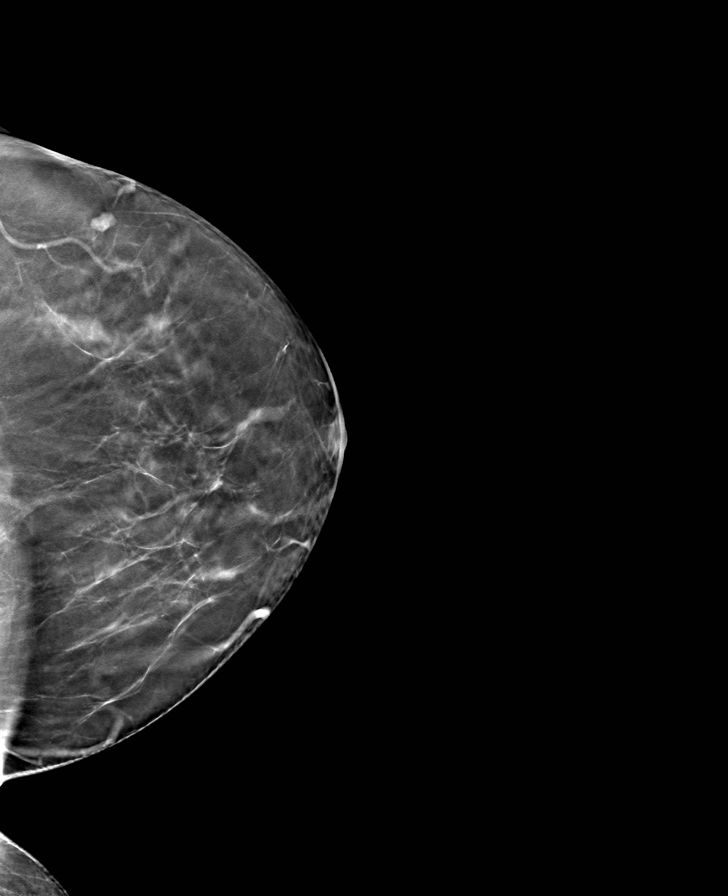

[L MLO tomo · tomo slice 45/88.0]
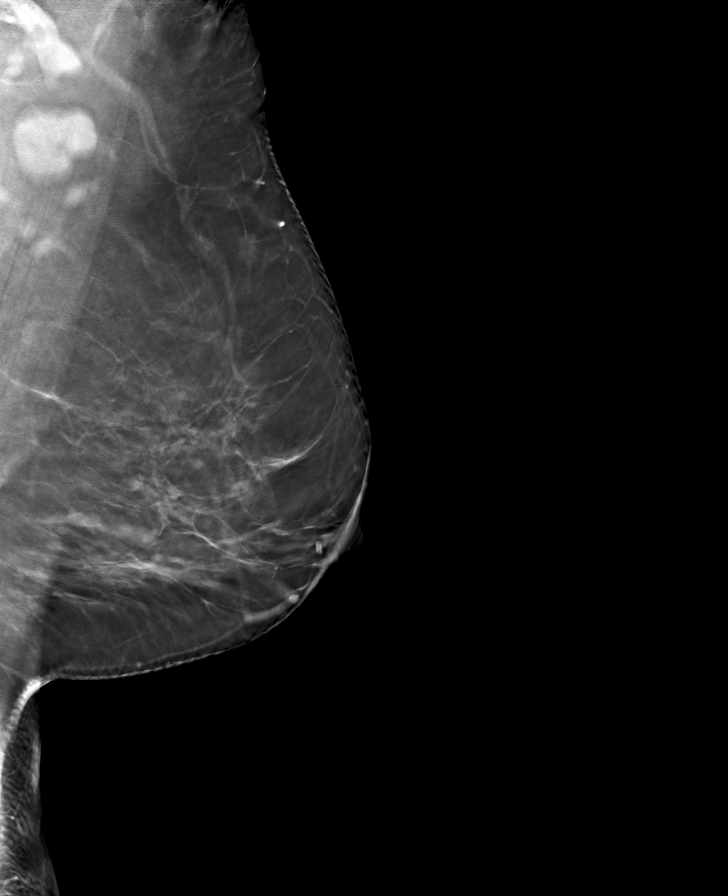

[R MLO tomo · tomo slice 40/79.0]
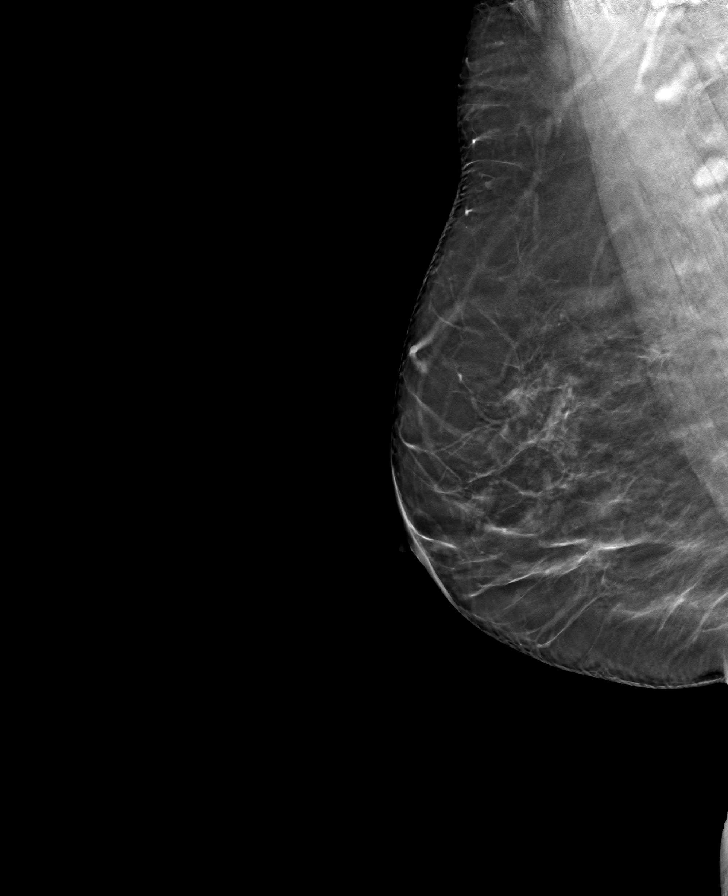

[8 of 24 positions shown; findings below may reference images not displayed]

ACR Breast Density Category b: There are scattered areas of
fibroglandular density.
FINDINGS: In the left axilla, a possible mass warrants further evaluation. In
the right breast, no findings suspicious for malignancy.
IMPRESSION: Further evaluation is suggested for possible mass in the left
axilla.

RECOMMENDATION:
Ultrasound of the left axilla. (Code:92-E-CC1)

The patient will be contacted regarding the findings, and additional
imaging will be scheduled.

BI-RADS CATEGORY  0: Incomplete. Need additional imaging evaluation
and/or prior mammograms for comparison.

## 2022-01-15 ENCOUNTER — Other Ambulatory Visit: Payer: Self-pay | Admitting: Internal Medicine

## 2022-01-15 DIAGNOSIS — Z1231 Encounter for screening mammogram for malignant neoplasm of breast: Secondary | ICD-10-CM

## 2022-01-23 ENCOUNTER — Ambulatory Visit
Admission: RE | Admit: 2022-01-23 | Discharge: 2022-01-23 | Disposition: A | Discharge status: Home / Self Care | Payer: BC Managed Care – PPO | Source: Ambulatory Visit | Attending: Internal Medicine | Admitting: Internal Medicine

## 2022-01-23 DIAGNOSIS — Z1231 Encounter for screening mammogram for malignant neoplasm of breast: Secondary | ICD-10-CM

## 2022-06-03 ENCOUNTER — Other Ambulatory Visit (HOSPITAL_COMMUNITY): Payer: Self-pay | Admitting: Nurse Practitioner

## 2022-06-03 DIAGNOSIS — R1011 Right upper quadrant pain: Secondary | ICD-10-CM

## 2022-06-09 ENCOUNTER — Encounter (INDEPENDENT_AMBULATORY_CARE_PROVIDER_SITE_OTHER): Payer: Self-pay | Admitting: *Deleted

## 2022-06-13 ENCOUNTER — Ambulatory Visit (HOSPITAL_COMMUNITY)
Admission: RE | Admit: 2022-06-13 | Discharge: 2022-06-13 | Disposition: A | Discharge status: Home / Self Care | Payer: BC Managed Care – PPO | Source: Ambulatory Visit | Attending: Nurse Practitioner | Admitting: Nurse Practitioner

## 2022-06-13 DIAGNOSIS — R1011 Right upper quadrant pain: Secondary | ICD-10-CM

## 2022-08-19 ENCOUNTER — Ambulatory Visit (INDEPENDENT_AMBULATORY_CARE_PROVIDER_SITE_OTHER): Payer: BC Managed Care – PPO | Admitting: Gastroenterology

## 2022-08-28 ENCOUNTER — Ambulatory Visit: Payer: BC Managed Care – PPO | Admitting: Adult Health

## 2022-10-09 ENCOUNTER — Encounter: Payer: Self-pay | Admitting: Adult Health

## 2022-10-09 ENCOUNTER — Ambulatory Visit (INDEPENDENT_AMBULATORY_CARE_PROVIDER_SITE_OTHER): Payer: BC Managed Care – PPO | Admitting: Adult Health

## 2022-10-09 ENCOUNTER — Other Ambulatory Visit (HOSPITAL_COMMUNITY)
Admission: RE | Admit: 2022-10-09 | Discharge: 2022-10-09 | Disposition: A | Discharge status: Home / Self Care | Payer: BC Managed Care – PPO | Source: Ambulatory Visit | Attending: Adult Health | Admitting: Adult Health

## 2022-10-09 VITALS — BP 150/97 | HR 75 | Ht 63.5 in | Wt 181.5 lb

## 2022-10-09 DIAGNOSIS — Z01419 Encounter for gynecological examination (general) (routine) without abnormal findings: Secondary | ICD-10-CM

## 2022-10-09 DIAGNOSIS — Z1211 Encounter for screening for malignant neoplasm of colon: Secondary | ICD-10-CM

## 2022-10-09 DIAGNOSIS — R030 Elevated blood-pressure reading, without diagnosis of hypertension: Secondary | ICD-10-CM | POA: Diagnosis not present

## 2022-10-09 LAB — HEMOCCULT GUIAC POC 1CARD (OFFICE): Fecal Occult Blood, POC: NEGATIVE

## 2022-10-09 NOTE — Progress Notes (Signed)
Patient ID: Cassandra Taylor, female   DOB: 1961/01/29, 62 y.o.   MRN: [id number] History of Present Illness: Cassandra Taylor is a 62 year old white female,married, PM, in for a well woman gyn exam and pap.  PCP is Dr Margo Aye.   Current Medications, Allergies, Past Medical History, Past Surgical History, Family History and Social History were reviewed in [employer] record.     Review of Systems: Patient denies any headaches, hearing loss, fatigue, blurred vision, shortness of breath, chest pain, abdominal pain, problems with bowel movements, urination, or intercourse(not active). No joint pain or mood swings.  Denies any vaginal bleeding Has had people in her house and not slept well for last 3 nights and has had caffeine today    Physical Exam:BP (!) 150/97 (BP Location: Left Arm, Patient Position: Sitting, Cuff Size: Normal)   Pulse 75   Ht 5' 3.5" (1.613 m)   Wt 181 lb 8 oz (82.3 kg)   LMP  (LMP Unknown) Comment: pre menopausal  BMI 31.65 kg/m   General:  Well developed, well nourished, no acute distress Skin:  Warm and dry Neck:  Midline trachea, normal thyroid, good ROM, no lymphadenopathy Lungs; Clear to auscultation bilaterally Breast:  No dominant palpable mass, retraction, or nipple discharge Cardiovascular: Regular rate and rhythm Abdomen:  Soft, non tender, no hepatosplenomegaly Pelvic:  External genitalia is normal in appearance, no lesions.  The vagina is pale. Urethra has no lesions or masses. The cervix is smooth,pap with HR HPV genotyping performed.  Uterus is felt to be normal size, shape, and contour.  No adnexal masses or tenderness noted.Bladder is non tender, no masses felt. Rectal: Good sphincter tone, no polyps, or hemorrhoids felt.  Hemoccult negative. Extremities/musculoskeletal:  No swelling, + varicosities noted, no clubbing or cyanosis Psych:  No mood changes, alert and cooperative,seems happy AA is 1 Fall risk is low    10/09/2022   11:34  AM  Depression screen PHQ 2/9  Decreased Interest 0  Down, Depressed, Hopeless 0  PHQ - 2 Score 0  Altered sleeping 0  Tired, decreased energy 0  Change in appetite 0  Feeling bad or failure about yourself  0  Trouble concentrating 0  Moving slowly or fidgety/restless 0  Suicidal thoughts 0  PHQ-9 Score 0       10/09/2022   11:34 AM  GAD 7 : Generalized Anxiety Score  Nervous, Anxious, on Edge 0  Control/stop worrying 0  Worry too much - different things 0  Trouble relaxing 0  Restless 0  Easily annoyed or irritable 0  Afraid - awful might happen 0  Total GAD 7 Score 0      Upstream - 10/09/22 1142       Pregnancy Intention Screening   Does the patient want to become pregnant in the next year? N/A    Does the patient's partner want to become pregnant in the next year? N/A    Would the patient like to discuss contraceptive options today? N/A      Contraception Wrap Up   Current Method Abstinence   PM   End Method Abstinence   PM   Contraception Counseling Provided No             Examination chaperoned by Malachy Mood LPN   Impression and Plan: 1. Encounter for gynecological examination with Papanicolaou smear of cervix Pap sent Pap in 3 years if normal Physical in 1 year Labs with PCP Mammogram was negative 01/23/22 Colonoscopy  per GI  - Cytology - PAP( [facility])  2. Encounter for screening fecal occult blood testing Hemoccult was negative   3. Elevated BP without diagnosis of hypertension Keep check on BP at home and follow up with PCP

## 2022-10-13 LAB — CYTOLOGY - PAP
Adequacy: ABSENT
Comment: NEGATIVE
Diagnosis: NEGATIVE
High risk HPV: NEGATIVE

## 2023-01-16 ENCOUNTER — Other Ambulatory Visit: Payer: Self-pay | Admitting: Internal Medicine

## 2023-01-16 DIAGNOSIS — Z1231 Encounter for screening mammogram for malignant neoplasm of breast: Secondary | ICD-10-CM

## 2023-02-12 ENCOUNTER — Ambulatory Visit: Payer: BC Managed Care – PPO

## 2023-03-16 ENCOUNTER — Ambulatory Visit: Payer: BC Managed Care – PPO

## 2023-04-08 ENCOUNTER — Ambulatory Visit
Admission: RE | Admit: 2023-04-08 | Discharge: 2023-04-08 | Disposition: A | Discharge status: Home / Self Care | Payer: BC Managed Care – PPO | Source: Ambulatory Visit | Attending: Internal Medicine | Admitting: Internal Medicine

## 2023-04-08 DIAGNOSIS — Z1231 Encounter for screening mammogram for malignant neoplasm of breast: Secondary | ICD-10-CM

## 2023-04-09 ENCOUNTER — Ambulatory Visit: Payer: BC Managed Care – PPO

## 2023-05-25 ENCOUNTER — Other Ambulatory Visit (HOSPITAL_COMMUNITY): Payer: Self-pay | Admitting: Nurse Practitioner

## 2023-05-25 DIAGNOSIS — R1310 Dysphagia, unspecified: Secondary | ICD-10-CM

## 2023-05-29 ENCOUNTER — Ambulatory Visit (HOSPITAL_COMMUNITY)

## 2023-06-01 ENCOUNTER — Ambulatory Visit (HOSPITAL_COMMUNITY)
Admission: RE | Admit: 2023-06-01 | Discharge: 2023-06-01 | Disposition: A | Discharge status: Home / Self Care | Source: Ambulatory Visit | Attending: Nurse Practitioner | Admitting: Nurse Practitioner

## 2023-06-01 DIAGNOSIS — R1310 Dysphagia, unspecified: Secondary | ICD-10-CM | POA: Diagnosis present

## 2023-06-18 ENCOUNTER — Encounter (INDEPENDENT_AMBULATORY_CARE_PROVIDER_SITE_OTHER): Payer: Self-pay | Admitting: *Deleted

## 2023-07-13 ENCOUNTER — Encounter (INDEPENDENT_AMBULATORY_CARE_PROVIDER_SITE_OTHER): Payer: Self-pay | Admitting: Gastroenterology

## 2023-07-13 ENCOUNTER — Ambulatory Visit (INDEPENDENT_AMBULATORY_CARE_PROVIDER_SITE_OTHER): Admitting: Gastroenterology

## 2023-07-13 VITALS — BP 149/87 | HR 81 | Temp 98.5°F | Ht 63.5 in | Wt 181.0 lb

## 2023-07-13 DIAGNOSIS — J029 Acute pharyngitis, unspecified: Secondary | ICD-10-CM

## 2023-07-13 DIAGNOSIS — R1013 Epigastric pain: Secondary | ICD-10-CM

## 2023-07-13 DIAGNOSIS — R079 Chest pain, unspecified: Secondary | ICD-10-CM | POA: Insufficient documentation

## 2023-07-13 DIAGNOSIS — R0789 Other chest pain: Secondary | ICD-10-CM

## 2023-07-13 MED ORDER — DICYCLOMINE HCL 10 MG PO CAPS: 10.0000 mg | ORAL_CAPSULE | Freq: Two times a day (BID) | ORAL | 2 refills | Status: AC | PRN

## 2023-07-13 NOTE — Patient Instructions (Addendum)
 If interested in having further evaluation of your symptoms, please call back to our clinic to schedule EGD Start Bentyl  1 tablet q12h as needed for abdominal pain Continue famotidine 40 mg daily

## 2023-07-13 NOTE — Progress Notes (Signed)
 [place], M.D. Gastroenterology & Hepatology [facility] Gastroenterology [address] [place], [place] [address] Primary Care Physician: Cassandra Bickers, MD [address] [place] [address]  Referring MD: PCP  Chief Complaint:  chest and epigastric discomfort, sore throat.  History of Present Illness: Cassandra Taylor is a 63 y.o. female with no past medical history who presents for evaluation of chest and epigastric discomfort, sore throat.  Patient reports that for a few years she has had intermittent episodes of pain in the epigastric area,  described as a dull pain, which radiates to the mid chest and throat. These episodes are present every 6-8 weeks. Patient reports that it does not usually happen after eating, usually in the morning. No nighttime episodes. Happens when she moves around. Also reports having radiation of the pain to her spine. Pain can last 5-30 minutes. Has improved with rest and when laying down. Does not take any medications.   Also reports that she has had a sore throat for the last 3 months. She does not have dysphagia, but has frequent fullness in her throat. Has very mild occasional chest pain, but it is not her dominant symptoms.  Due to this, she was seen by ENT provider (was seen Cassandra Flick, NP, not seen by physician) and underwent laryngoscopy which showed some mild edema of the arytenoids. Was advised to continue famotidine 40 mg at night for possible LPR. Has a follow up with them in 3 months.  Patient has had constipation with famotidine. Has felt it has decreased the abdominal pain but not the sore throat.She is fearful of trying a PPI as mother developed dementia and believes this was related to this medication.  She has also felt a lump at the right side of her neck. Had a normal thyroid  US  in 06/02/2023.  The patient denies having any nausea, vomiting, fever, chills, hematochezia, melena, hematemesis,  abdominal distention, diarrhea, jaundice, pruritus or weight loss.  Last EGD: many years ago, was told she had a hiatal hernia - had it performed at [facility] Last Colonoscopy: Had it performed at [facility], reports she had it performed in 2021 and was norma, no report available, she was advised to repeat in 5 year due to history of polyps.  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   No past surgery      Family History: Family History  Problem Relation Age of Onset   COPD Father    Alzheimer's disease Mother    COPD Brother    Rheum arthritis Sister    Rheum arthritis Sister     Social History: Social History   Tobacco Use  Smoking Status Never  Smokeless Tobacco Never   Social History   Substance and Sexual Activity  Alcohol Use Yes   Comment: rarely   Social History   Substance and Sexual Activity  Drug Use Never    Allergies: No Known Allergies  Medications: Current Outpatient Medications  Medication Sig Dispense Refill   cyclobenzaprine (FLEXERIL) 5 MG tablet Take 5 mg by  mouth. Takes as needed     dicyclomine  (BENTYL ) 10 MG capsule Take 1 capsule (10 mg total) by mouth every 12 (twelve) hours as needed (abdominal pain). 60 capsule 2   famotidine (PEPCID) 40 MG tablet Take 40 mg by mouth daily.     ibuprofen (ADVIL) 200 MG tablet Take 400 mg by mouth at bedtime.      No current facility-administered medications for this visit.    Review of Systems: GENERAL: negative for malaise, night sweats HEENT: No changes in hearing or vision, no nose bleeds or  other nasal problems. NECK: Negative for lumps, goiter, pain and significant neck swelling RESPIRATORY: Negative for cough, wheezing CARDIOVASCULAR: Negative for chest pain, leg swelling, palpitations, orthopnea GI: SEE HPI MUSCULOSKELETAL: Negative for joint pain or swelling, back pain, and muscle pain. SKIN: Negative for lesions, rash PSYCH: Negative for sleep disturbance, mood disorder and recent psychosocial stressors. HEMATOLOGY Negative for prolonged bleeding, bruising easily, and swollen nodes. ENDOCRINE: Negative for cold or heat intolerance, polyuria, polydipsia and goiter. NEURO: negative for tremor, gait imbalance, syncope and seizures. The remainder of the review of systems is noncontributory.   Physical Exam: BP (!) 149/87   Pulse 81   Temp 98.5 F (36.9 C) (Oral)   Ht 5' 3.5" (1.613 m)   Wt 181 lb (82.1 kg)   LMP  (LMP Unknown) Comment: pre menopausal  BMI 31.56 kg/m  GENERAL: The patient is AO x3, in no acute distress. HEENT: Head is normocephalic and atraumatic. EOMI are intact. Mouth is well hydrated and without lesions. NECK: Supple. No masses LUNGS: Clear to auscultation. No presence of rhonchi/wheezing/rales. Adequate chest expansion HEART: RRR, normal s1 and s2. ABDOMEN:  mildly tender in the epigastric area, no guarding, no peritoneal signs, and nondistended. BS +. No masses. EXTREMITIES: Without any cyanosis, clubbing, rash, lesions or edema. NEUROLOGIC: AOx3, no focal motor deficit. SKIN: no jaundice, no rashes   Imaging/Labs: as above  I personally reviewed and interpreted the available labs, imaging and endoscopic files.  Impression and Plan: [place] is a 63 y.o. female with no past medical history who presents for evaluation of chest and epigastric discomfort, sore throat.  Patient has presented different symptoms in her thoracic and upper abdominal area which were initially attributed to possible LPR given findings on laryngoscopy.  She  has presented some mild improvement with the use of famotidine but the symptoms have not completely resolved.  We discussed that in order to better evaluate her symptoms, we could proceed with an EGD to evaluate if there is endoscopic evidence of GERD or if there are other etiologies that could explain her symptoms.  She would like to hold off on proceeding with this for now.  She was also offered the possibility of switching her current medication for a PPI but given her family history of dementia she would like to avoid this unless strictly necessary.  She will let us  know if she wants to try any of the offered options.  I also explained that it was possible she is presenting bowel hypersensitivity as a cause of her ongoing symptoms, which may achieve symptom improvement with the use of Bentyl  as needed.   -If interested in having further evaluation of your symptoms, please call back to our clinic to schedule EGD -Start Bentyl  1 tablet q12h as needed for abdominal pain -Continue famotidine 40 mg daily  All questions were answered.      Cassandra Cress, MD Gastroenterology and Hepatology [facility]  Gastroenterology

## 2023-07-13 NOTE — Progress Notes (Signed)
 Aaron Aas

## 2023-09-21 ENCOUNTER — Encounter (INDEPENDENT_AMBULATORY_CARE_PROVIDER_SITE_OTHER): Payer: Self-pay | Admitting: *Deleted

## 2023-09-21 ENCOUNTER — Telehealth (INDEPENDENT_AMBULATORY_CARE_PROVIDER_SITE_OTHER): Payer: Self-pay | Admitting: *Deleted

## 2023-09-21 NOTE — Telephone Encounter (Signed)
 Please schedule this in room 1, diagnosis epigastric and chest pain. Thanks

## 2023-09-21 NOTE — Telephone Encounter (Signed)
 Pt has been scheduled for 10/07/23. Instructions sent via mychart.

## 2023-09-21 NOTE — Telephone Encounter (Signed)
Pt is calling back to schedule EGD. Please advise      Thank you

## 2023-09-21 NOTE — Telephone Encounter (Signed)
 LMOVM to return call.

## 2023-10-01 NOTE — Telephone Encounter (Signed)
 Thanks for the update.  If she wants to reschedule, she will need to come to the office first.

## 2023-10-01 NOTE — Telephone Encounter (Signed)
 Pt called to cancel her procedure on 10/07/23. She did not give a reason why cancelling

## 2023-10-02 NOTE — Telephone Encounter (Signed)
 noted

## 2023-10-07 ENCOUNTER — Encounter (HOSPITAL_COMMUNITY): Admission: RE | Payer: Self-pay | Source: Home / Self Care

## 2023-10-07 ENCOUNTER — Ambulatory Visit (HOSPITAL_COMMUNITY): Admission: RE | Admit: 2023-10-07 | Source: Home / Self Care | Admitting: Gastroenterology

## 2023-10-07 SURGERY — EGD (ESOPHAGOGASTRODUODENOSCOPY)
Anesthesia: Choice

## 2023-10-30 ENCOUNTER — Encounter (INDEPENDENT_AMBULATORY_CARE_PROVIDER_SITE_OTHER): Payer: Self-pay | Admitting: Gastroenterology

## 2024-01-06 ENCOUNTER — Encounter (INDEPENDENT_AMBULATORY_CARE_PROVIDER_SITE_OTHER): Payer: Self-pay | Admitting: Gastroenterology

## 2024-04-04 ENCOUNTER — Other Ambulatory Visit: Payer: Self-pay | Admitting: Internal Medicine

## 2024-04-04 DIAGNOSIS — Z1231 Encounter for screening mammogram for malignant neoplasm of breast: Secondary | ICD-10-CM

## 2024-04-20 ENCOUNTER — Ambulatory Visit
Admission: RE | Admit: 2024-04-20 | Discharge: 2024-04-20 | Disposition: A | Source: Ambulatory Visit | Attending: Internal Medicine | Admitting: Internal Medicine

## 2024-04-20 DIAGNOSIS — Z1231 Encounter for screening mammogram for malignant neoplasm of breast: Secondary | ICD-10-CM
